# Patient Record
Sex: Female | Born: 1950 | Race: Black or African American | Hispanic: No | Marital: Married | Smoking: Former smoker
Health system: Southern US, Community
[De-identification: ages and names within clinical notes are randomized; demographics above are authoritative.]

## PROBLEM LIST (undated history)

## (undated) DIAGNOSIS — E876 Hypokalemia: Secondary | ICD-10-CM

## (undated) DIAGNOSIS — D649 Anemia, unspecified: Secondary | ICD-10-CM

## (undated) DIAGNOSIS — M797 Fibromyalgia: Secondary | ICD-10-CM

## (undated) DIAGNOSIS — E079 Disorder of thyroid, unspecified: Secondary | ICD-10-CM

## (undated) DIAGNOSIS — F419 Anxiety disorder, unspecified: Secondary | ICD-10-CM

## (undated) DIAGNOSIS — K2970 Gastritis, unspecified, without bleeding: Secondary | ICD-10-CM

## (undated) DIAGNOSIS — R5383 Other fatigue: Secondary | ICD-10-CM

## (undated) DIAGNOSIS — R51 Headache: Secondary | ICD-10-CM

## (undated) HISTORY — DX: Disorder of thyroid, unspecified: E07.9

## (undated) HISTORY — PX: JOINT REPLACEMENT: SHX530

## (undated) HISTORY — DX: Headache: R51

## (undated) HISTORY — PX: EYE SURGERY: SHX253

## (undated) HISTORY — DX: Fibromyalgia: M79.7

## (undated) HISTORY — DX: Anemia, unspecified: D64.9

## (undated) HISTORY — DX: Gastritis, unspecified, without bleeding: K29.70

## (undated) HISTORY — DX: Anxiety disorder, unspecified: F41.9

## (undated) HISTORY — PX: SPINE SURGERY: SHX786

## (undated) HISTORY — DX: Other fatigue: R53.83

## (undated) HISTORY — DX: Hypokalemia: E87.6

---

## 2009-12-02 ENCOUNTER — Encounter: Admission: RE | Admit: 2009-12-02 | Discharge: 2009-12-02 | Payer: Self-pay | Admitting: *Deleted

## 2010-03-06 ENCOUNTER — Inpatient Hospital Stay (HOSPITAL_COMMUNITY): Admission: RE | Admit: 2010-03-06 | Discharge: 2010-03-10 | Payer: Self-pay | Admitting: Orthopedic Surgery

## 2010-03-22 ENCOUNTER — Ambulatory Visit: Payer: Self-pay | Admitting: Vascular Surgery

## 2010-03-22 ENCOUNTER — Encounter (INDEPENDENT_AMBULATORY_CARE_PROVIDER_SITE_OTHER): Payer: Self-pay | Admitting: Orthopedic Surgery

## 2010-03-22 ENCOUNTER — Ambulatory Visit
Admission: RE | Admit: 2010-03-22 | Discharge: 2010-03-22 | Payer: Self-pay | Source: Home / Self Care | Admitting: Orthopedic Surgery

## 2010-06-20 ENCOUNTER — Ambulatory Visit (HOSPITAL_COMMUNITY): Admission: RE | Admit: 2010-06-20 | Discharge: 2010-06-20 | Payer: Self-pay | Admitting: Orthopedic Surgery

## 2010-06-26 ENCOUNTER — Inpatient Hospital Stay (HOSPITAL_COMMUNITY): Admission: EM | Admit: 2010-06-26 | Discharge: 2010-06-28 | Payer: Self-pay | Admitting: Emergency Medicine

## 2010-06-27 ENCOUNTER — Encounter (INDEPENDENT_AMBULATORY_CARE_PROVIDER_SITE_OTHER): Payer: Self-pay | Admitting: Internal Medicine

## 2010-06-27 ENCOUNTER — Ambulatory Visit: Payer: Self-pay | Admitting: Vascular Surgery

## 2010-06-30 ENCOUNTER — Inpatient Hospital Stay (HOSPITAL_COMMUNITY): Admission: RE | Admit: 2010-06-30 | Discharge: 2010-07-03 | Payer: Self-pay | Admitting: Orthopedic Surgery

## 2010-12-28 LAB — COMPREHENSIVE METABOLIC PANEL
ALT: 11 U/L (ref 0–35)
AST: 16 U/L (ref 0–37)
Albumin: 3.1 g/dL — ABNORMAL LOW (ref 3.5–5.2)
CO2: 28 mEq/L (ref 19–32)
Chloride: 106 mEq/L (ref 96–112)
Creatinine, Ser: 0.89 mg/dL (ref 0.4–1.2)
GFR calc Af Amer: >60 mL/min (ref >60)
GFR calc non Af Amer: >60 mL/min (ref >60)
Sodium: 139 mEq/L (ref 135–145)
Total Bilirubin: 0.9 mg/dL (ref 0.3–1.2)

## 2010-12-28 LAB — APTT: aPTT: 42 seconds — ABNORMAL HIGH (ref 24–37)

## 2010-12-28 LAB — PROTIME-INR
INR: 1.01 (ref 0.00–1.49)
INR: 1.01 (ref 0.00–1.49)
Prothrombin Time: 13.5 seconds (ref 11.6–15.2)
Prothrombin Time: 17.4 seconds — ABNORMAL HIGH (ref 11.6–15.2)
Prothrombin Time: 17.8 seconds — ABNORMAL HIGH (ref 11.6–15.2)
Prothrombin Time: 13.5 seconds (ref 11.6–15.2)

## 2010-12-28 LAB — CBC
HCT: 24 % — ABNORMAL LOW (ref 36.0–46.0)
HCT: 32.5 % — ABNORMAL LOW (ref 36.0–46.0)
Hemoglobin: 8 g/dL — ABNORMAL LOW (ref 12.0–15.0)
Hemoglobin: 8.3 g/dL — ABNORMAL LOW (ref 12.0–15.0)
Hemoglobin: 9.4 g/dL — ABNORMAL LOW (ref 12.0–15.0)
MCH: 25.6 pg — ABNORMAL LOW (ref 26.0–34.0)
MCH: 26.1 pg (ref 26.0–34.0)
MCH: 24.8 pg — ABNORMAL LOW (ref 26.0–34.0)
MCH: 25.6 pg — ABNORMAL LOW (ref 26.0–34.0)
MCHC: 31.9 g/dL (ref 30.0–36.0)
MCHC: 32.5 g/dL (ref 30.0–36.0)
MCHC: 33.3 g/dL (ref 30.0–36.0)
MCHC: 32.2 g/dL (ref 30.0–36.0)
MCHC: 32.6 g/dL (ref 30.0–36.0)
MCV: 79.7 fL (ref 78.0–100.0)
MCV: 78 fL (ref 78.0–100.0)
MCV: 78.5 fL (ref 78.0–100.0)
Platelets: 202 10*3/uL (ref 150–400)
RBC: 3.7 MIL/uL — ABNORMAL LOW (ref 3.87–5.11)
RDW: 15.2 % (ref 11.5–15.5)
RDW: 15.2 % (ref 11.5–15.5)
RDW: 15.2 % (ref 11.5–15.5)
RDW: 15.3 % (ref 11.5–15.5)
RDW: 15.5 % (ref 11.5–15.5)
WBC: 14.3 10*3/uL — ABNORMAL HIGH (ref 4.0–10.5)
WBC: 12.1 10*3/uL — ABNORMAL HIGH (ref 4.0–10.5)

## 2010-12-28 LAB — URINE MICROSCOPIC-ADD ON

## 2010-12-28 LAB — BASIC METABOLIC PANEL
BUN: 14 mg/dL (ref 6–23)
CO2: 29 mEq/L (ref 19–32)
Calcium: 8.8 mg/dL (ref 8.4–10.5)
Calcium: 9.5 mg/dL (ref 8.4–10.5)
Chloride: 106 mEq/L (ref 96–112)
Creatinine, Ser: 0.81 mg/dL (ref 0.4–1.2)
GFR calc Af Amer: >60 mL/min (ref >60)
GFR calc Af Amer: >60 mL/min (ref >60)
GFR calc Af Amer: >60 mL/min (ref >60)
GFR calc non Af Amer: >60 mL/min (ref >60)
GFR calc non Af Amer: >60 mL/min (ref >60)
Glucose, Bld: 139 mg/dL — ABNORMAL HIGH (ref 70–99)
Glucose, Bld: 92 mg/dL (ref 70–99)
Potassium: 3.7 mEq/L (ref 3.5–5.1)
Sodium: 131 mEq/L — ABNORMAL LOW (ref 135–145)
Sodium: 139 mEq/L (ref 135–145)
Sodium: 141 mEq/L (ref 135–145)

## 2010-12-28 LAB — CK TOTAL AND CKMB (NOT AT ARMC): CK, MB: 1 ng/mL (ref 0.3–4.0)

## 2010-12-28 LAB — URINALYSIS, ROUTINE W REFLEX MICROSCOPIC
Bilirubin Urine: NEGATIVE
Glucose, UA: NEGATIVE mg/dL
Hgb urine dipstick: NEGATIVE
Nitrite: NEGATIVE
Nitrite: NEGATIVE
Protein, ur: NEGATIVE mg/dL
Specific Gravity, Urine: 1.021 (ref 1.005–1.030)
Specific Gravity, Urine: 1.018 (ref 1.005–1.030)
Urobilinogen, UA: 1 mg/dL (ref 0.0–1.0)

## 2010-12-28 LAB — TYPE AND SCREEN
ABO/RH(D): A POS
ABO/RH(D): A POS
Antibody Screen: POSITIVE
DAT, IgG: NEGATIVE
Donor AG Type: NEGATIVE

## 2010-12-28 LAB — DIFFERENTIAL
Basophils Absolute: 0 10*3/uL (ref 0.0–0.1)
Eosinophils Absolute: 0.4 10*3/uL (ref 0.0–0.7)
Eosinophils Relative: 3 % (ref 0–5)
Lymphocytes Relative: 25 % (ref 12–46)
Lymphocytes Relative: 25 % (ref 12–46)
Monocytes Absolute: 0.7 10*3/uL (ref 0.1–1.0)
Neutro Abs: 6 10*3/uL (ref 1.7–7.7)
Neutrophils Relative %: 66 % (ref 43–77)

## 2010-12-28 LAB — CARDIAC PANEL(CRET KIN+CKTOT+MB+TROPI)
CK, MB: 0.7 ng/mL (ref 0.3–4.0)
Relative Index: INVALID (ref 0.0–2.5)
Relative Index: INVALID (ref 0.0–2.5)
Total CK: 45 U/L (ref 7–177)
Troponin I: 0.01 ng/mL (ref 0.00–0.06)
Troponin I: 0.02 ng/mL (ref 0.00–0.06)

## 2010-12-28 LAB — POCT CARDIAC MARKERS: Troponin i, poc: <0.05 ng/mL (ref 0.00–0.09)

## 2010-12-28 LAB — POCT I-STAT, CHEM 8
Calcium, Ion: 1.14 mmol/L (ref 1.12–1.32)
Chloride: 100 mEq/L (ref 96–112)
Creatinine, Ser: 1.1 mg/dL (ref 0.4–1.2)
Glucose, Bld: 103 mg/dL — ABNORMAL HIGH (ref 70–99)
HCT: 37 % (ref 36.0–46.0)
Hemoglobin: 12.6 g/dL (ref 12.0–15.0)
Sodium: 137 mEq/L (ref 135–145)
TCO2: 28 mmol/L (ref 0–100)

## 2010-12-28 LAB — GLUCOSE, CAPILLARY: Glucose-Capillary: 111 mg/dL — ABNORMAL HIGH (ref 70–99)

## 2010-12-28 LAB — SURGICAL PCR SCREEN
MRSA, PCR: NEGATIVE
Staphylococcus aureus: POSITIVE — AB

## 2010-12-28 LAB — URINE CULTURE

## 2010-12-28 LAB — BRAIN NATRIURETIC PEPTIDE: Pro B Natriuretic peptide (BNP): 119 pg/mL — ABNORMAL HIGH (ref 0.0–100.0)

## 2011-01-01 LAB — CBC
HCT: 29.4 % — ABNORMAL LOW (ref 36.0–46.0)
HCT: 31.6 % — ABNORMAL LOW (ref 36.0–46.0)
HCT: 36 % (ref 36.0–46.0)
Hemoglobin: 10.2 g/dL — ABNORMAL LOW (ref 12.0–15.0)
MCHC: 32.3 g/dL (ref 30.0–36.0)
MCHC: 33.2 g/dL (ref 30.0–36.0)
MCV: 84.8 fL (ref 78.0–100.0)
MCV: 85.6 fL (ref 78.0–100.0)
MCV: 85.7 fL (ref 78.0–100.0)
Platelets: 208 10*3/uL (ref 150–400)
Platelets: 220 10*3/uL (ref 150–400)
Platelets: 273 10*3/uL (ref 150–400)
RDW: 14.2 % (ref 11.5–15.5)
RDW: 14.4 % (ref 11.5–15.5)
RDW: 14.5 % (ref 11.5–15.5)

## 2011-01-01 LAB — TYPE AND SCREEN
ABO/RH(D): A POS
ABO/RH(D): A POS
DAT, IgG: NEGATIVE
Donor AG Type: NEGATIVE
PT AG Type: NEGATIVE

## 2011-01-01 LAB — BASIC METABOLIC PANEL
BUN: 16 mg/dL (ref 6–23)
CO2: 31 mEq/L (ref 19–32)
Calcium: 9.1 mg/dL (ref 8.4–10.5)
Chloride: 102 mEq/L (ref 96–112)
Creatinine, Ser: 1.12 mg/dL (ref 0.4–1.2)
Glucose, Bld: 140 mg/dL — ABNORMAL HIGH (ref 70–99)
Glucose, Bld: 78 mg/dL (ref 70–99)
Potassium: 3.5 mEq/L (ref 3.5–5.1)
Potassium: 3.6 mEq/L (ref 3.5–5.1)
Sodium: 139 mEq/L (ref 135–145)

## 2011-01-01 LAB — DIFFERENTIAL
Basophils Absolute: 0 10*3/uL (ref 0.0–0.1)
Basophils Relative: 0 % (ref 0–1)
Eosinophils Absolute: 0.2 10*3/uL (ref 0.0–0.7)
Eosinophils Relative: 2 % (ref 0–5)
Neutrophils Relative %: 76 % (ref 43–77)

## 2011-01-01 LAB — PROTIME-INR
INR: 1.32 (ref 0.00–1.49)
Prothrombin Time: 14.3 seconds (ref 11.6–15.2)
Prothrombin Time: 15.2 seconds (ref 11.6–15.2)
Prothrombin Time: 16.3 seconds — ABNORMAL HIGH (ref 11.6–15.2)

## 2011-01-01 LAB — URINALYSIS, ROUTINE W REFLEX MICROSCOPIC
Glucose, UA: NEGATIVE mg/dL
Hgb urine dipstick: NEGATIVE
Ketones, ur: 15 mg/dL — AB
Protein, ur: NEGATIVE mg/dL

## 2011-01-12 IMAGING — CR DG KNEE 1-2V PORT*L*
3 series · 3 of 3 positions shown · non-contrast
Comparison: None.

CLINICAL DATA: Post left total knee replacement

PORTABLE LEFT KNEE - 1-2 VIEW

[view not recorded (1 of 3)]
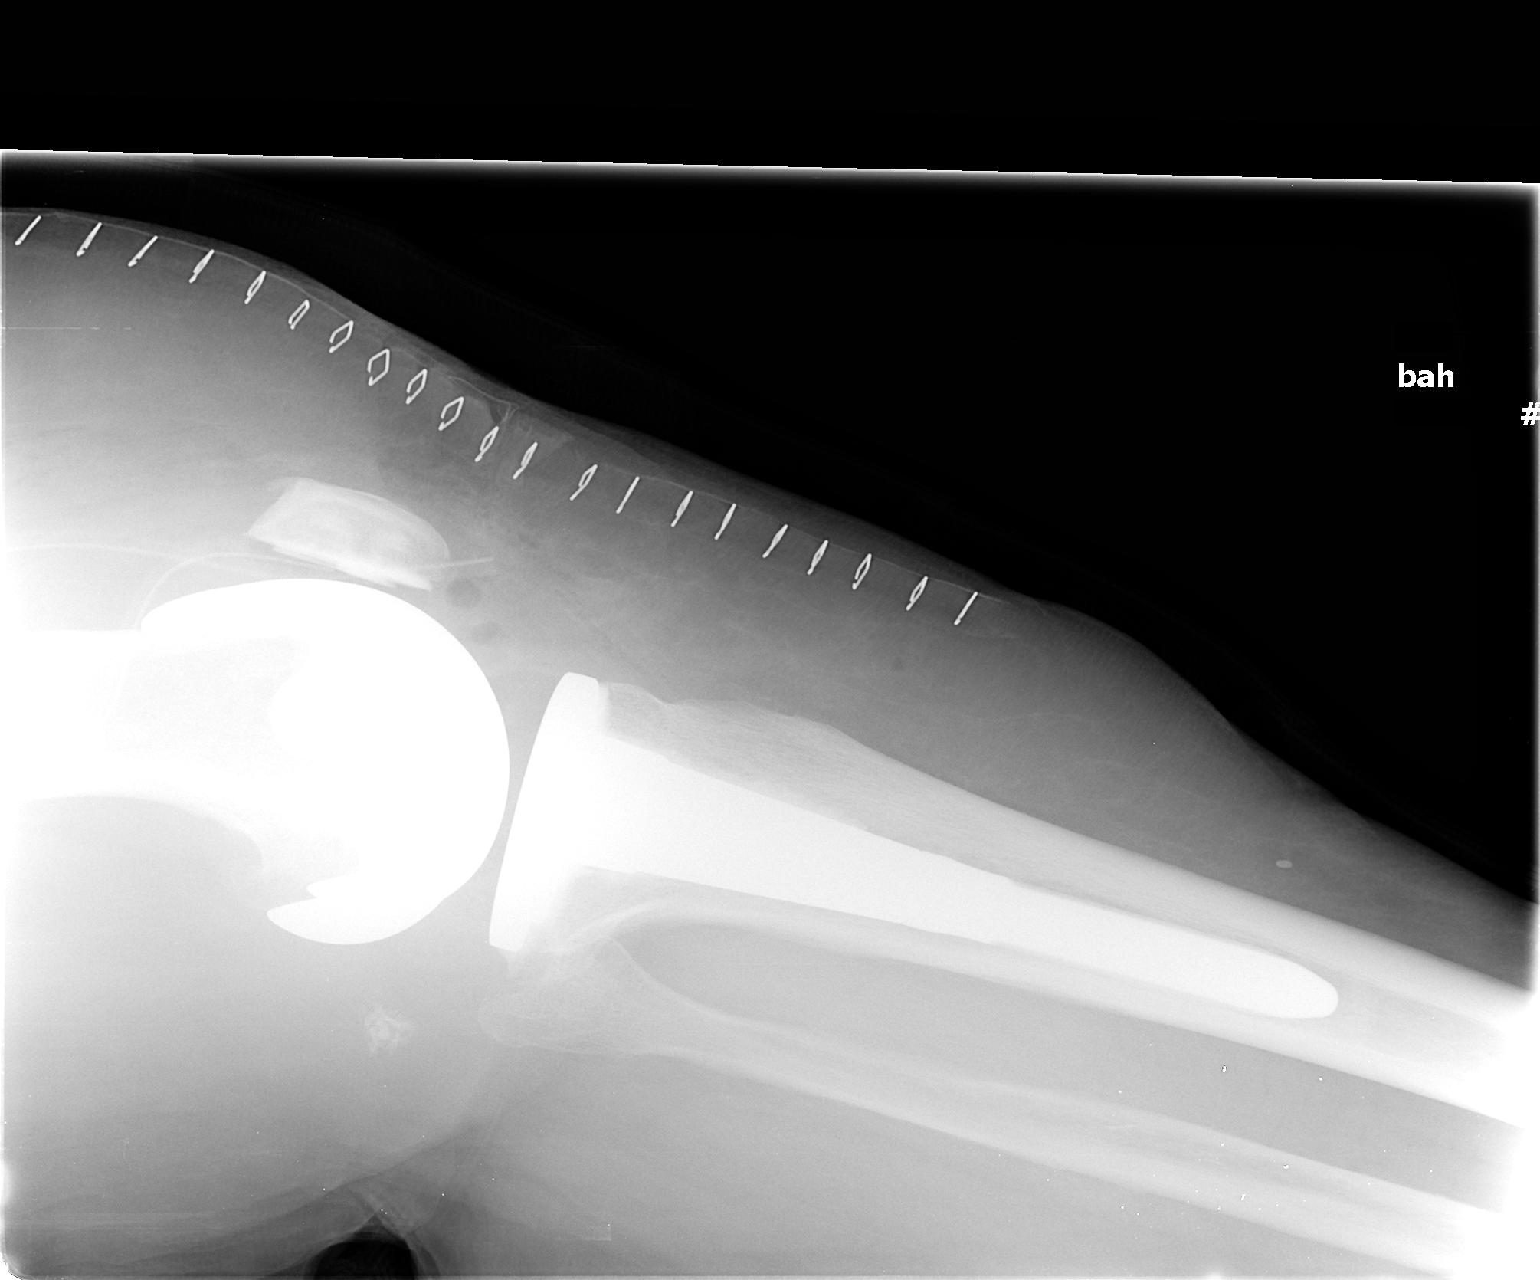

[view not recorded (2 of 3)]
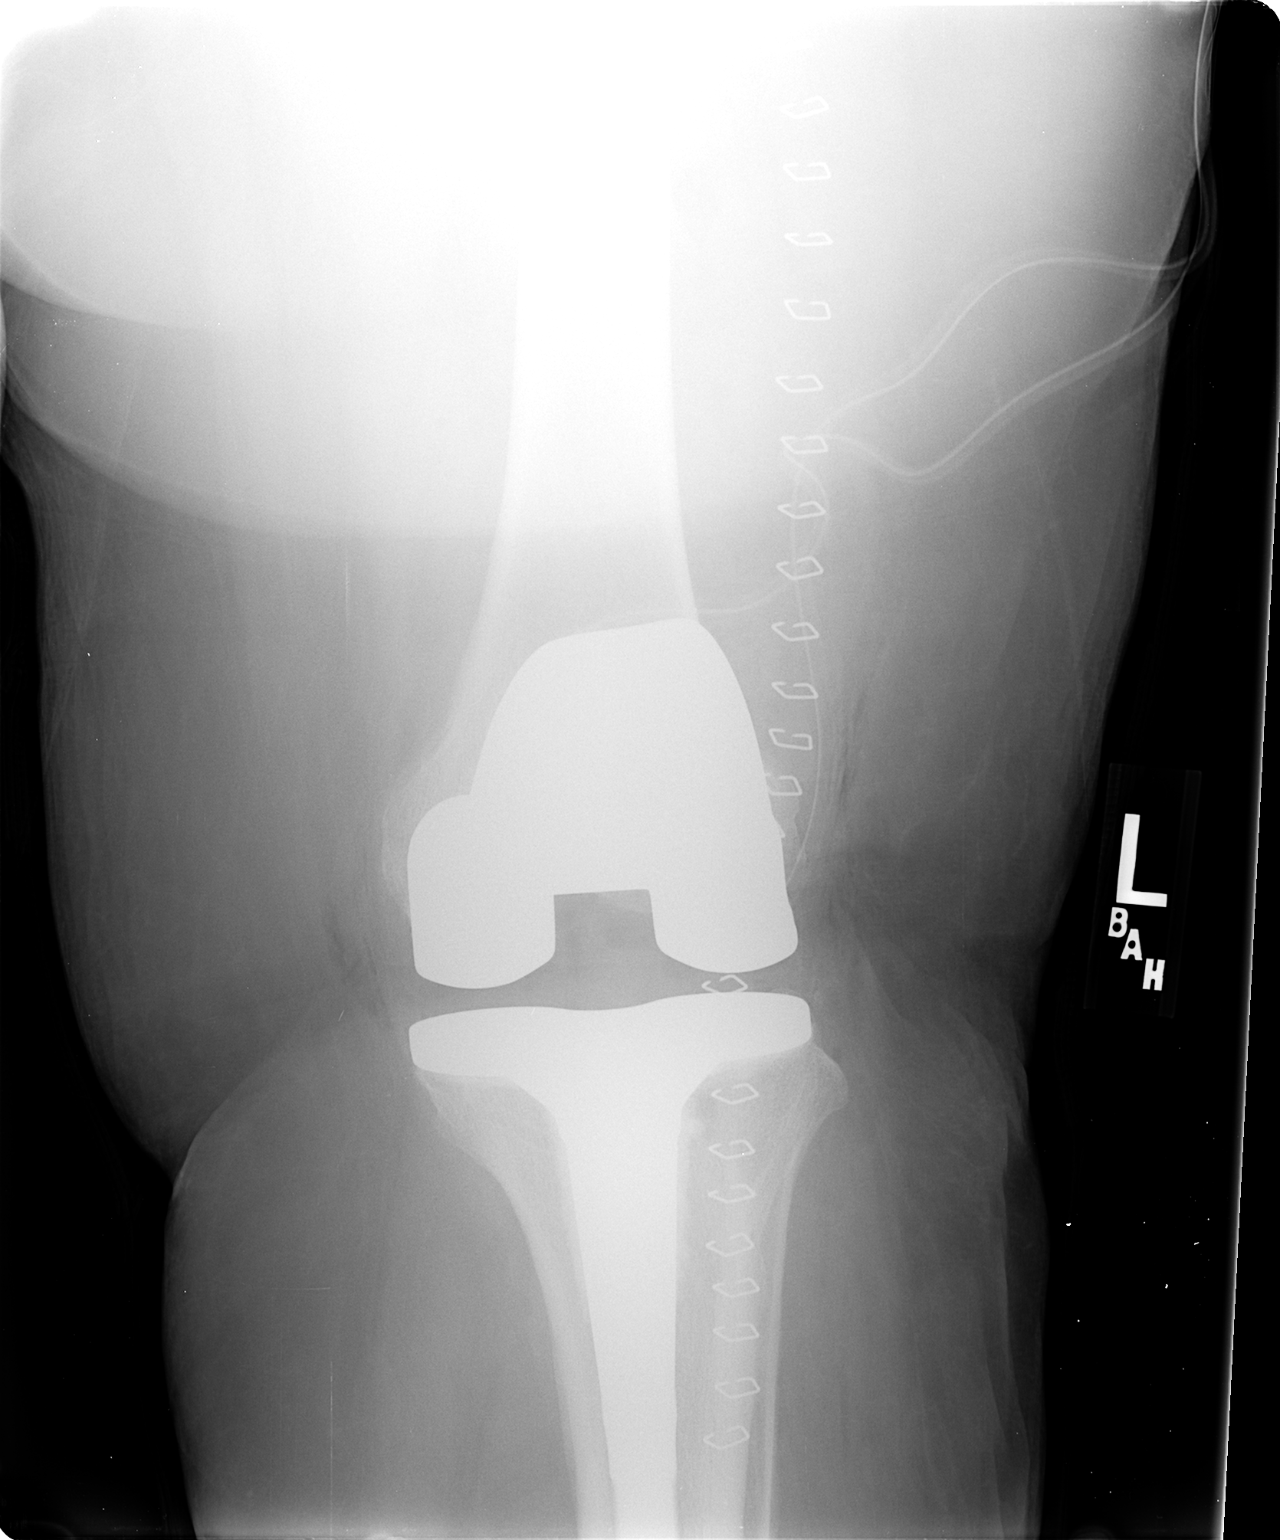

[view not recorded (3 of 3)]
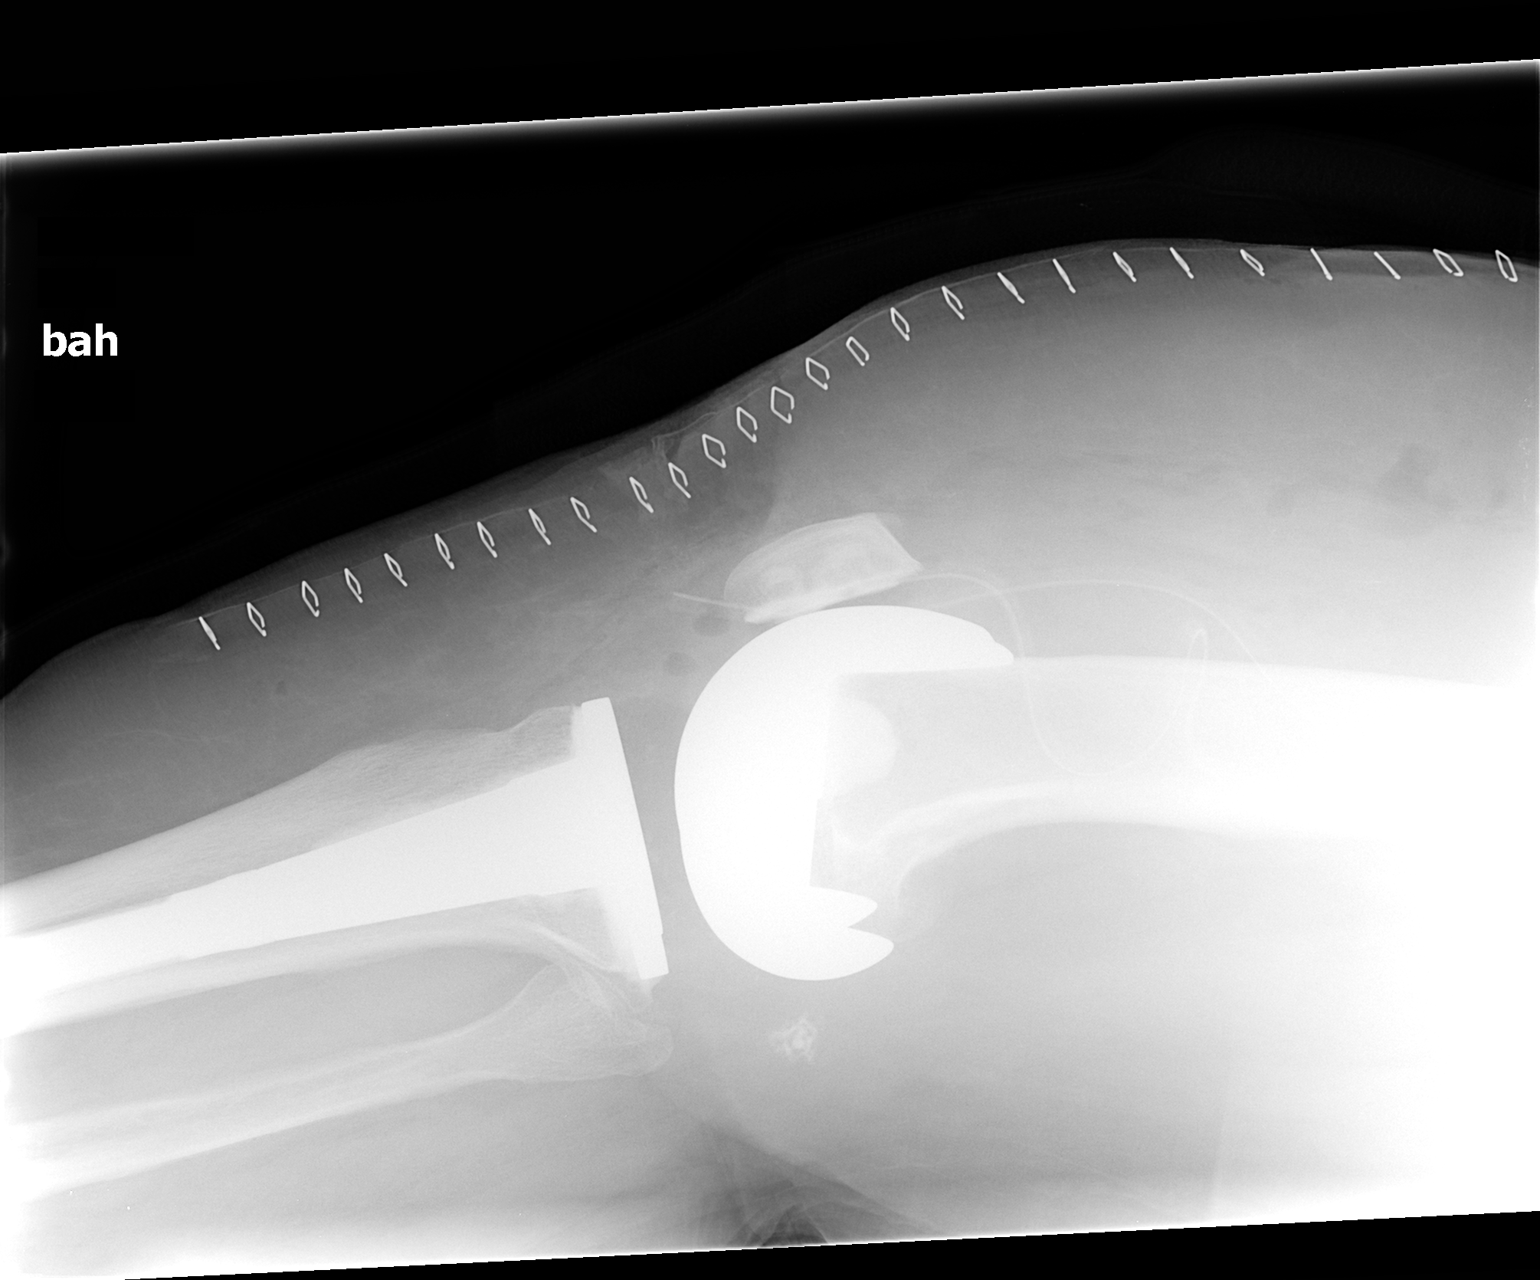

[3 of 3 positions shown; findings below may reference images not displayed]

FINDINGS: AP and cross-table lateral views show good position and
alignment of bony elements and prosthetic hardware, following left
total knee replacement.  A surgical drain is in the knee joint
space.
IMPRESSION: Good position and alignment following left total knee replacement.

## 2012-09-01 ENCOUNTER — Ambulatory Visit (INDEPENDENT_AMBULATORY_CARE_PROVIDER_SITE_OTHER): Payer: BC Managed Care – PPO | Admitting: Psychiatry

## 2012-09-01 ENCOUNTER — Encounter (HOSPITAL_COMMUNITY): Payer: Self-pay | Admitting: Psychiatry

## 2012-09-01 ENCOUNTER — Telehealth (HOSPITAL_COMMUNITY): Payer: Self-pay | Admitting: *Deleted

## 2012-09-01 VITALS — Ht 62.5 in | Wt 264.6 lb

## 2012-09-01 DIAGNOSIS — F411 Generalized anxiety disorder: Secondary | ICD-10-CM

## 2012-09-01 DIAGNOSIS — F419 Anxiety disorder, unspecified: Secondary | ICD-10-CM | POA: Insufficient documentation

## 2012-09-01 DIAGNOSIS — F5105 Insomnia due to other mental disorder: Secondary | ICD-10-CM

## 2012-09-01 DIAGNOSIS — F332 Major depressive disorder, recurrent severe without psychotic features: Secondary | ICD-10-CM

## 2012-09-01 DIAGNOSIS — F339 Major depressive disorder, recurrent, unspecified: Secondary | ICD-10-CM

## 2012-09-01 MED ORDER — ZOLPIDEM TARTRATE 10 MG PO TABS: 10.0000 mg | ORAL_TABLET | Freq: Every evening | ORAL | Status: DC | PRN

## 2012-09-01 MED ORDER — ALPRAZOLAM 0.5 MG PO TABS: 0.5000 mg | ORAL_TABLET | Freq: Four times a day (QID) | ORAL | Status: DC | PRN

## 2012-09-01 NOTE — Patient Instructions (Addendum)
Continue current effective medications  RTC 3 months 15 minutes  Keep meditation going.

## 2012-09-01 NOTE — Progress Notes (Signed)
Psychiatric Assessment Adult  Patient Identification:  Melinda Wilcox Date of Evaluation:  09/01/2012 Chief Complaint: I'm depressed and my son just died suddenly.   History of Chief Complaint:   Chief Complaint  Patient presents with  . Depression  . Establish Care  . Other    pain, insomna    HPI Pt has been in treatment for depression for 30 years or more.  She had been tried on several different meds, but responds well to NAME BRAND Prozac.  The generic dose not work.  She has been on Xanax for a very long time.  She has noted some forgetfulness like exact dates and all, but can generally recall the year that things happened.   Review of Systems On meds for blood pressure, arthritis,, and breathing.  She has gotten off oxygen this year. Physical Exam Height 5' 2.5" (1.588 m), weight 264 lb 9.6 oz (120.022 kg).  Depressive Symptoms: depressed mood, anhedonia, insomnia, hypersomnia, fatigue, impaired memory, anxiety, panic attacks, hypersomnia, loss of energy/fatigue, disturbed sleep, weight loss, decreased labido,  (Hypo) Manic Symptoms:   Elevated Mood:  No Irritable Mood:  No Grandiosity:  No Distractibility:  No Labiality of Mood:  No Delusions:  No Hallucinations:  No Impulsivity:  No Sexually Inappropriate Behavior:  No Financial Extravagance:  No Flight of Ideas:  No  Anxiety Symptoms: Excessive Worry:  Yes Panic Symptoms:  Yes Agoraphobia:  Yes Obsessive Compulsive: No  Symptoms: None, Specific Phobias:  No Social Anxiety:  No  Psychotic Symptoms:  Hallucinations: No None Delusions:  No Paranoia:  No   Ideas of Reference:  No  PTSD Symptoms: Ever had a traumatic exposure:  Yes Had a traumatic exposure in the last month:  Yes Re-experiencing: No None Hypervigilance:  Yes Hyperarousal: No None Avoidance: No None  Traumatic Brain Injury: No   Past Psychiatric History: Diagnosis: Major Depression, recurrent on Xanax  Hospitalizations:  none  Outpatient Care: Dr Cleon Gustin  Substance Abuse Care: none  Self-Mutilation: none  Suicidal Attempts: none  Violent Behaviors: none   Past Medical History:   Past Medical History  Diagnosis Date  . Thyroid disease   . Anemia   . Fibromyalgia   . Gastritis   . Hypokalemia   . Headache   . Fatigue   . Anxiety    History of Loss of Consciousness:  No Seizure History:  No Cardiac History:  Yes Allergies:  Not on File Current Medications:  Current Outpatient Prescriptions  Medication Sig Dispense Refill  . ALPRAZolam (XANAX) 0.25 MG tablet Take 0.5 mg by mouth 3 (three) times daily as needed.      Marland Kitchen FLUoxetine (PROZAC) 40 MG capsule Take 40 mg by mouth daily.      Marland Kitchen zolpidem (AMBIEN) 10 MG tablet Take 10 mg by mouth at bedtime as needed.        Previous Psychotropic Medications:  Medication Dose   Lots of different antiderpessants   Prozac 40mg   Xanax 1 mg a day.               Substance Abuse History in the last 12 months: Substance Age of 1st Use Last Use Amount Specific Type  Nicotine  15 Age 34    Alcohol none       Cannabis  none     Opiates  59 Age 38    Cocaine  none     Methamphetamines  none       LSD  none  Ecstasy  none       Benzodiazepines  30's today 0.5mg  Xanax  Caffeine  childhood  two days ago  cup  coffee  Inhalants          Others:                          Medical Consequences of Substance Abuse: none  Legal Consequences of Substance Abuse: none  Family Consequences of Substance Abuse: none  Blackouts:  no DT's:  No Withdrawal Symptoms:  No   Social History: Current Place of Residence: [place], [place] Place of Birth: [place], [place] Family Members:  Marital Status:  Married Children:   Sons: deceased  Daughters: one Relationships:  Education:  Corporate treasurer Problems/Performance: fine Religious Beliefs/Practices: protestant History of Abuse: none Teacher, music History:  None. Legal  History: none Hobbies/Interests: crafts, computers  Family History:   Family History  Problem Relation Age of Onset  . ADD / ADHD Neg Hx   . Alcohol abuse Neg Hx   . Anxiety disorder Neg Hx   . Bipolar disorder Neg Hx   . Dementia Neg Hx   . Depression Neg Hx   . Drug abuse Neg Hx   . OCD Neg Hx   . Paranoid behavior Neg Hx   . Physical abuse Neg Hx   . Schizophrenia Neg Hx   . Seizures Neg Hx   . Sexual abuse Neg Hx     Mental Status Examination/Evaluation: Objective:  Appearance: Casual  Eye Contact::  Good  Speech:  Clear and Coherent  Volume:  Normal  Mood:  sad  Affect:  Blunt  Thought Process:  Coherent  Orientation:  Full  Thought Content:  WDL  Suicidal Thoughts:  No  Homicidal Thoughts:  No  Judgement:  Good  Insight:  Good  Psychomotor Activity:  Normal  Akathisia:  No  Handed:  Right  AIMS (if indicated):    Assets:  Communication Skills Desire for Improvement    Laboratory/X-Ray Psychological Evaluation(s)        Assessment:    AXIS I Generalized Anxiety Disorder and Major Depression, Recurrent severe  AXIS II Deferred  AXIS III Past Medical History  Diagnosis Date  . Thyroid disease   . Anemia   . Fibromyalgia   . Gastritis   . Hypokalemia   . Headache   . Fatigue   . Anxiety      AXIS IV other psychosocial or environmental problems  AXIS V 51-60 moderate symptoms   Treatment Plan/Recommendations:  Plan of Care: Continue current meds  Laboratory:    Psychotherapy: none  Medications: Prozac, Xanax  Routine PRN Medications:  Yes  Consultations: none  Safety Concerns:  none  Other:      Orson Aloe, MD 11/18/201311:54 AM

## 2012-12-02 ENCOUNTER — Ambulatory Visit (HOSPITAL_COMMUNITY): Payer: Self-pay | Admitting: Psychiatry

## 2013-03-16 ENCOUNTER — Encounter (HOSPITAL_COMMUNITY): Payer: Self-pay | Admitting: Psychiatry

## 2013-03-16 ENCOUNTER — Ambulatory Visit (INDEPENDENT_AMBULATORY_CARE_PROVIDER_SITE_OTHER): Payer: MEDICARE | Admitting: Psychiatry

## 2013-03-16 VITALS — BP 110/78 | Ht 61.25 in | Wt 263.6 lb

## 2013-03-16 DIAGNOSIS — F5105 Insomnia due to other mental disorder: Secondary | ICD-10-CM

## 2013-03-16 DIAGNOSIS — F411 Generalized anxiety disorder: Secondary | ICD-10-CM

## 2013-03-16 DIAGNOSIS — F419 Anxiety disorder, unspecified: Secondary | ICD-10-CM

## 2013-03-16 DIAGNOSIS — F332 Major depressive disorder, recurrent severe without psychotic features: Secondary | ICD-10-CM

## 2013-03-16 DIAGNOSIS — F339 Major depressive disorder, recurrent, unspecified: Secondary | ICD-10-CM

## 2013-03-16 MED ORDER — FLUOXETINE HCL 20 MG PO CAPS: 20.0000 mg | ORAL_CAPSULE | Freq: Every day | ORAL | Status: DC

## 2013-03-16 MED ORDER — ALPRAZOLAM 0.5 MG PO TABS: 0.5000 mg | ORAL_TABLET | Freq: Four times a day (QID) | ORAL | Status: DC | PRN

## 2013-03-16 NOTE — Patient Instructions (Signed)
Set a timer for 8 or a certain number minutes and walk for that amount of time in the house or in the yard.  Mark the number of minutes on a calendar for that day.  Do that every day this week.  Then next week increase the time by 1 minutes and then mark the calendar with the number of minutes for that day.  Each week increase your exercise by one minute.  Keep a record of this so you can see the progress you are making.  Do this every day, just like eating and sleeping.  It is good for pain control, depression, and for your soul/spirit.  Bring the record in for your next visit so we can talk about your effort and how you feel with the new exercise program going and working for you.  Relaxation is the ultimate solution for you.  You can seek it through tub baths, bubble baths, essential oils or incense, walking or chatting with friends, listening to soft music, watching a candle burn and just letting all thoughts go and appreciating the true essence of the Creator.  Pets or animals may be very helpful.  You might spend some time with them and then go do more directed meditation.  "I am Wishes Fulfilled Meditation" by Marylene Buerger and Lyndal Pulley may be helpful MUSIC for getting to sleep or for meditating You can order it from on line.  You might find the Chill channel on Pandora and explore the artists that you like better.   Take care of yourself.  No one else is standing up to do the job and only you know what you need.   GET SERIOUS about taking care of yourself.  Do the next right thing and that often means doing something to care for yourself along the lines of are you hungry, are you angry, are you lonely, are you tired, are you scared?  HALTS is what that stands for.  Call if problems or concerns.

## 2013-03-16 NOTE — Progress Notes (Signed)
[facility] [address] Progress Note CHRISTL FESSENDEN MRN: [id number] DOB: 04-Jan-1951 Age: 62 y.o.  Date: 03/16/2013 Start Time: 11:00 AM End Time: 11:30 AM  Chief Complaint: Chief Complaint  Patient presents with  . Depression  . Follow-up  . Medication Refill   Subjective: "I have been staying in my room.  I have been having panic attacks when I leave my room.  There was as problem with switching over to a new insurance and I couldn't afford much of the Xanax and I have been streatching them out and I found an old script. Depression 5/10 and Anxiety 4/10, where 0 is none and 10 is the worst.  Pain is 6/10 for her back.    The patient returns for follow-up appointment.  Pt reports that she is compliant with the psychotropic medications with good benefit and no noticeable side effects.  But the effect is lacking.  Pt agrees to try a higher dose.   Current Meds: Xanax 0.5 mg every other day Prozac 40 mg about every day Ambien 10 mg at night occasionally  HPI Pt has been in treatment for depression for 30 years or more.  She had been tried on several different meds, but responds well to NAME BRAND Prozac.  The generic dose not work.  She has been on Xanax for a very long time.  She has noted some forgetfulness like exact dates and all, but can generally recall the year that things happened.   Review of Systems On meds for blood pressure, arthritis,, and breathing.  She has gotten off oxygen this year. Physical Exam Blood pressure 110/78, height 5' 1.25" (1.556 m), weight 263 lb 9.6 oz (119.568 kg).  Depressive Symptoms: depressed mood, anhedonia, insomnia, hypersomnia, fatigue, impaired memory, anxiety, panic attacks, hypersomnia, loss of energy/fatigue, disturbed sleep, weight loss, decreased labido,  (Hypo) Manic Symptoms:   Elevated Mood:  No Irritable Mood:  No Grandiosity:  No Distractibility:  No Labiality of Mood:  No Delusions:  No Hallucinations:   No Impulsivity:  No Sexually Inappropriate Behavior:  No Financial Extravagance:  No Flight of Ideas:  No  Anxiety Symptoms: Excessive Worry:  Yes Panic Symptoms:  Yes Agoraphobia:  Yes Obsessive Compulsive: No  Symptoms: None, Specific Phobias:  No Social Anxiety:  No  Psychotic Symptoms:  Hallucinations: No None Delusions:  No Paranoia:  No   Ideas of Reference:  No  PTSD Symptoms: Ever had a traumatic exposure:  Yes Had a traumatic exposure in the last month:  Yes Re-experiencing: No None Hypervigilance:  Yes Hyperarousal: No None Avoidance: No None  Traumatic Brain Injury: No   Past Psychiatric History: Diagnosis: Major Depression, recurrent on Xanax  Hospitalizations: none  Outpatient Care: Dr Cleon Gustin  Substance Abuse Care: none  Self-Mutilation: none  Suicidal Attempts: none  Violent Behaviors: none   Past Medical History:   Past Medical History  Diagnosis Date  . Thyroid disease   . Anemia   . Fibromyalgia   . Gastritis   . Hypokalemia   . Headache(784.0)   . Fatigue   . Anxiety    History of Loss of Consciousness:  No Seizure History:  No Cardiac History:  Yes  Allergies: No Known Allergies Medical History: Past Medical History  Diagnosis Date  . Thyroid disease   . Anemia   . Fibromyalgia   . Gastritis   . Hypokalemia   . Headache(784.0)   . Fatigue   . Anxiety    Surgical History: Past Surgical History  Procedure Laterality Date  . Joint replacement    . Spine surgery    . Eye surgery     Family History: family history is negative for ADD / ADHD, and Alcohol abuse, and Anxiety disorder, and Bipolar disorder, and Dementia, and Depression, and Drug abuse, and OCD, and Paranoid behavior, and Physical abuse, and Schizophrenia, and Seizures, and Sexual abuse, . Reviewed again to day in the office setting.  Previous Psychotropic Medications:  Medication Dose   Lots of different antiderpessants   Prozac 40mg   Xanax 1 mg a day.    Substance Abuse History in the last 12 months: Substance Age of 1st Use Last Use Amount Specific Type  Nicotine  15 Age 36    Alcohol none       Cannabis  none     Opiates  59 Age 44    Cocaine  none     Methamphetamines  none       LSD  none        Ecstasy  none       Benzodiazepines  30's today 0.5mg  Xanax  Caffeine  childhood  two days ago  cup  coffee  Inhalants          Others:      Medical Consequences of Substance Abuse: none Legal Consequences of Substance Abuse: none Family Consequences of Substance Abuse: none Blackouts:  no DT's:  No Withdrawal Symptoms:  No   Social History: Current Place of Residence: [place], [place] Place of Birth: [place], [place] Family Members:  Marital Status:  Married Children:   Sons: deceased  Daughters: one Relationships:  Education:  Corporate treasurer Problems/Performance: fine Religious Beliefs/Practices: protestant History of Abuse: none Teacher, music History:  None. Legal History: none Hobbies/Interests: crafts, computers  Mental Status Examination/Evaluation: Objective:  Appearance: Casual  Eye Contact::  Good  Speech:  Clear and Coherent  Volume:  Normal  Mood:  sad  Affect:  Blunt  Thought Process:  Coherent  Orientation:  Full  Thought Content:  WDL  Suicidal Thoughts:  No  Homicidal Thoughts:  No  Judgement:  Good  Insight:  Good  Psychomotor Activity:  Normal  Akathisia:  No  Handed:  Right  AIMS (if indicated):    Assets:  Communication Skills Desire for Improvement   Assessment:   AXIS I Generalized Anxiety Disorder and Major Depression, Recurrent severe  AXIS II Deferred  AXIS III Past Medical History  Diagnosis Date  . Thyroid disease   . Anemia   . Fibromyalgia   . Gastritis   . Hypokalemia   . Headache(784.0)   . Fatigue   . Anxiety      AXIS IV other psychosocial or environmental problems  AXIS V 51-60 moderate symptoms   Treatment  Plan/Recommendations:  Psychotherapy: none  Medications: Prozac, Xanax  Routine PRN Medications:  Yes  Consultations: none  Safety Concerns:  none  Other:     Plan/Discussion: I took her vitals.  I reviewed CC, tobacco/med/surg Hx, meds effects/ side effects, problem list, therapies and responses as well as current situation/symptoms discussed options. Continue current effective medications with increase in Prozac for better mood control. See orders and pt instructions for more details.  MEDICATIONS this encounter: Meds ordered this encounter  Medications  . FLUoxetine (PROZAC) 20 MG capsule    Sig: Take 1 capsule (20 mg total) by mouth daily.    Dispense:  30 capsule    Refill:  2  . ALPRAZolam (XANAX) 0.5 MG  tablet    Sig: Take 1 tablet (0.5 mg total) by mouth 4 (four) times daily as needed for anxiety.    Dispense:  120 tablet    Refill:  0   Medical Decision Making Problem Points:  Established problem, worsening (2), Review of last therapy session (1) and Review of psycho-social stressors (1) Data Points:  Review and summation of old records (2) Review of medication regiment & side effects (2)  I certify that outpatient services furnished can reasonably be expected to improve the patient's condition.   Orson Aloe, MD, [facility]

## 2013-03-23 ENCOUNTER — Telehealth (HOSPITAL_COMMUNITY): Payer: Self-pay | Admitting: Psychiatry

## 2013-03-27 ENCOUNTER — Telehealth (HOSPITAL_COMMUNITY): Payer: Self-pay | Admitting: Psychiatry

## 2013-03-30 ENCOUNTER — Telehealth (HOSPITAL_COMMUNITY): Payer: Self-pay | Admitting: Psychiatry

## 2013-03-30 DIAGNOSIS — F419 Anxiety disorder, unspecified: Secondary | ICD-10-CM

## 2013-03-30 MED ORDER — ALPRAZOLAM 0.5 MG PO TABS: 0.5000 mg | ORAL_TABLET | Freq: Four times a day (QID) | ORAL | Status: DC | PRN

## 2013-03-30 NOTE — Telephone Encounter (Signed)
Script printed out and pt called to come pick up for mailing to the mail order pharmacy.

## 2013-04-13 ENCOUNTER — Ambulatory Visit (HOSPITAL_COMMUNITY): Payer: Self-pay | Admitting: Psychiatry

## 2013-04-23 ENCOUNTER — Ambulatory Visit (INDEPENDENT_AMBULATORY_CARE_PROVIDER_SITE_OTHER): Payer: MEDICARE | Admitting: Psychiatry

## 2013-04-23 ENCOUNTER — Encounter (HOSPITAL_COMMUNITY): Payer: Self-pay | Admitting: Psychiatry

## 2013-04-23 ENCOUNTER — Ambulatory Visit (HOSPITAL_COMMUNITY): Payer: Self-pay | Admitting: Psychiatry

## 2013-04-23 VITALS — BP 126/100 | HR 73 | Wt 266.0 lb

## 2013-04-23 DIAGNOSIS — F339 Major depressive disorder, recurrent, unspecified: Secondary | ICD-10-CM

## 2013-04-23 DIAGNOSIS — M1990 Unspecified osteoarthritis, unspecified site: Secondary | ICD-10-CM | POA: Insufficient documentation

## 2013-04-23 DIAGNOSIS — F332 Major depressive disorder, recurrent severe without psychotic features: Secondary | ICD-10-CM

## 2013-04-23 DIAGNOSIS — F411 Generalized anxiety disorder: Secondary | ICD-10-CM

## 2013-04-23 DIAGNOSIS — K219 Gastro-esophageal reflux disease without esophagitis: Secondary | ICD-10-CM

## 2013-04-23 DIAGNOSIS — I10 Essential (primary) hypertension: Secondary | ICD-10-CM | POA: Insufficient documentation

## 2013-04-23 MED ORDER — FLUOXETINE HCL 20 MG PO CAPS: 20.0000 mg | ORAL_CAPSULE | Freq: Every day | ORAL | Status: DC

## 2013-04-23 NOTE — Progress Notes (Signed)
[facility] [address] Progress Note Melinda Wilcox MRN: [id number] DOB: Feb 25, 1951 Age: 62 y.o.  Date: 04/23/2013  Chief Complaint: Chief Complaint  Patient presents with  . Follow-up  . Medication Refill   Subjective: I'm doing better on the medication.  I wanted me refill on my medicine.  I'm sleeping better.  I anxiety and depression is much improved.  HPI Patient is a 62 year old [place] American female who came for her followup appointment.  She's compliant with her Prozac and Xanax.  She takes Xanax up to 4 times a day but usually she takes 3 a day.  She denies any recent panic attack or any nervousness.  She sleeping better.  She has chronic medical issues.  She likes to stay on Prozac 60 mg.  She's not reporting any side effects.  She denies any tremors or shakes.  She denies any crying spells.  She's not drinking or using any illegal substance.  Review of Systems  Constitutional: Negative.   Neurological: Negative.   Psychiatric/Behavioral: Negative.    Physical Exam  Psychiatric:  see mental status examination   Blood pressure 126/100, pulse 73, weight 266 lb (120.657 kg).   Traumatic Brain Injury: No   Past Psychiatric History: Diagnosis: Major Depression, recurrent on Xanax  Hospitalizations: none  Outpatient Care: Dr Cleon Gustin  Substance Abuse Care: none  Self-Mutilation: none  Suicidal Attempts: none  Violent Behaviors: none   Past Medical History:   Past Medical History  Diagnosis Date  . Thyroid disease   . Anemia   . Fibromyalgia   . Gastritis   . Hypokalemia   . Headache(784.0)   . Fatigue   . Anxiety    History of Loss of Consciousness:  No Seizure History:  No Cardiac History:  Yes  Allergies: No Known Allergies Medical History: Past Medical History  Diagnosis Date  . Thyroid disease   . Anemia   . Fibromyalgia   . Gastritis   . Hypokalemia   . Headache(784.0)   . Fatigue   . Anxiety    Surgical History: Past Surgical  History  Procedure Laterality Date  . Joint replacement    . Spine surgery    . Eye surgery     Family History: family history is negative for ADD / ADHD, and Alcohol abuse, and Anxiety disorder, and Bipolar disorder, and Dementia, and Depression, and Drug abuse, and OCD, and Paranoid behavior, and Physical abuse, and Schizophrenia, and Seizures, and Sexual abuse, . Reviewed again to day in the office setting.   Social History: Patient lives in [place].  She is married.  She has a Naval architect.  Mental Status Examination/Evaluation: Objective:  Appearance: Casual  Eye Contact::  Good  Speech:  Clear and Coherent  Volume:  Normal  Mood:  sad  Affect:  Blunt  Thought Process:  Coherent  Orientation:  Full  Thought Content:  WDL  Suicidal Thoughts:  No  Homicidal Thoughts:  No  Judgement:  Good  Insight:  Good  Psychomotor Activity:  Normal  Akathisia:  No  Handed:  Right  AIMS (if indicated):    Assets:  Communication Skills Desire for Improvement   Assessment:   AXIS I Generalized Anxiety Disorder and Major Depression, Recurrent severe  AXIS II Deferred  AXIS III Past Medical History  Diagnosis Date  . Thyroid disease   . Anemia   . Fibromyalgia   . Gastritis   . Hypokalemia   . Headache(784.0)   . Fatigue   .  Anxiety      AXIS IV other psychosocial or environmental problems  AXIS V 51-60 moderate symptoms   Treatment Plan/Recommendations:  Psychotherapy: none  Medications: Prozac, Xanax  Routine PRN Medications:  Yes  Consultations: none  Safety Concerns:  none  Other:     Plan/Discussion: Patient is doing better on her current Prozac and Xanax.  I would recommend to continue her medication.  Risk and benefit explain.  Recommend to call us back it is a question or concern if she would worsening of the symptom.  Followup in 3 months.  MEDICATIONS this encounter: Meds ordered this encounter  Medications  . carvedilol (COREG) 6.25 MG tablet     Sig:   . SYNTHROID 88 MCG tablet    Sig:   . lisinopril (PRINIVIL,ZESTRIL) 5 MG tablet    Sig:   . chlorthalidone (HYGROTON) 25 MG tablet    Sig:   . pantoprazole (PROTONIX) 40 MG tablet    Sig:   . KLOR-CON M20 20 MEQ tablet    Sig:   . prednisoLONE acetate (PRED FORTE) 1 % ophthalmic suspension    Sig:   . CELEBREX 200 MG capsule    Sig:   . FLUoxetine (PROZAC) 20 MG capsule    Sig: Take 1 capsule (20 mg total) by mouth daily.    Dispense:  90 capsule    Refill:  0   Medical Decision Making Problem Points:  Established problem, stable/improving (1), Review of last therapy session (1) and Review of psycho-social stressors (1) Data Points:  Review of medication regiment & side effects (2)  I certify that outpatient services furnished can reasonably be expected to improve the patient's condition.   Davonta Stroot T., MD

## 2013-07-08 ENCOUNTER — Telehealth (HOSPITAL_COMMUNITY): Payer: Self-pay | Admitting: Psychiatry

## 2013-07-08 ENCOUNTER — Other Ambulatory Visit (HOSPITAL_COMMUNITY): Payer: Self-pay | Admitting: Psychiatry

## 2013-07-08 DIAGNOSIS — F419 Anxiety disorder, unspecified: Secondary | ICD-10-CM

## 2013-07-08 MED ORDER — FLUOXETINE HCL 40 MG PO CAPS: 40.0000 mg | ORAL_CAPSULE | Freq: Every day | ORAL | Status: DC

## 2013-07-08 NOTE — Telephone Encounter (Signed)
prozac sent to pharmacy

## 2013-07-09 ENCOUNTER — Telehealth (HOSPITAL_COMMUNITY): Payer: Self-pay | Admitting: Psychiatry

## 2013-07-09 ENCOUNTER — Other Ambulatory Visit (HOSPITAL_COMMUNITY): Payer: Self-pay | Admitting: Psychiatry

## 2013-07-09 DIAGNOSIS — F339 Major depressive disorder, recurrent, unspecified: Secondary | ICD-10-CM

## 2013-07-09 MED ORDER — FLUOXETINE HCL 20 MG PO CAPS: 20.0000 mg | ORAL_CAPSULE | Freq: Every day | ORAL | Status: DC

## 2013-07-09 NOTE — Telephone Encounter (Signed)
Sent electronically 

## 2013-07-23 ENCOUNTER — Ambulatory Visit (HOSPITAL_COMMUNITY): Payer: Self-pay | Admitting: Psychiatry

## 2013-07-23 ENCOUNTER — Encounter (HOSPITAL_COMMUNITY): Payer: Self-pay | Admitting: Psychiatry

## 2013-09-17 ENCOUNTER — Ambulatory Visit (INDEPENDENT_AMBULATORY_CARE_PROVIDER_SITE_OTHER): Payer: MEDICARE | Admitting: Psychiatry

## 2013-09-17 ENCOUNTER — Encounter (HOSPITAL_COMMUNITY): Payer: Self-pay | Admitting: Psychiatry

## 2013-09-17 VITALS — BP 120/80 | Ht 62.0 in | Wt 264.0 lb

## 2013-09-17 DIAGNOSIS — F419 Anxiety disorder, unspecified: Secondary | ICD-10-CM

## 2013-09-17 DIAGNOSIS — F339 Major depressive disorder, recurrent, unspecified: Secondary | ICD-10-CM

## 2013-09-17 DIAGNOSIS — F411 Generalized anxiety disorder: Secondary | ICD-10-CM

## 2013-09-17 MED ORDER — FLUOXETINE HCL 40 MG PO CAPS: 40.0000 mg | ORAL_CAPSULE | Freq: Every day | ORAL | Status: DC

## 2013-09-17 MED ORDER — ALPRAZOLAM 0.5 MG PO TABS: 0.5000 mg | ORAL_TABLET | Freq: Four times a day (QID) | ORAL | Status: DC | PRN

## 2013-09-17 MED ORDER — FLUOXETINE HCL 20 MG PO CAPS: 20.0000 mg | ORAL_CAPSULE | Freq: Every day | ORAL | Status: DC
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
## 2014-01-14 ENCOUNTER — Telehealth (HOSPITAL_COMMUNITY): Payer: Self-pay | Admitting: *Deleted

## 2014-01-14 NOTE — Telephone Encounter (Signed)
ok 

## 2014-01-18 ENCOUNTER — Ambulatory Visit (HOSPITAL_COMMUNITY): Payer: Self-pay | Admitting: Psychiatry

## 2014-09-17 ENCOUNTER — Other Ambulatory Visit (HOSPITAL_COMMUNITY): Payer: Self-pay | Admitting: Psychiatry

## 2014-09-23 ENCOUNTER — Telehealth (HOSPITAL_COMMUNITY): Payer: Self-pay | Admitting: *Deleted

## 2014-09-23 NOTE — Telephone Encounter (Signed)
Pt pharmacy [facility] calling needing refills for pt Fluoxetine. Looked through pt chart and noticed pt had not been seen since 09/17/13. Pt was informed to return  Spoke with Deana and informed them that they need to inform pt that she should call office to schedule a follow up appt before meds can be refilled. Per Deana, she told pt to call our office to make appt and they agreed to call office to make an appt.  

## 2014-09-23 NOTE — Telephone Encounter (Signed)
Pt pharmacy [facility] calling needing refills for pt Fluoxetine. Looked through pt chart and noticed pt had not been seen since 09/17/13. Pt was informed to return  Spoke with Deana and informed them that they need to inform pt that she should call office to schedule a follow up appt before meds can be refilled. Per Deana, she told pt to call our office to make appt and they agreed to call office to make an appt.

## 2014-12-27 ENCOUNTER — Telehealth (HOSPITAL_COMMUNITY): Payer: Self-pay | Admitting: *Deleted

## 2014-12-27 NOTE — Telephone Encounter (Signed)
Phone call from Dr. Lavonia DraftsPatidar, would like you to call her regarding this patient's medications.

## 2014-12-27 NOTE — Telephone Encounter (Signed)
Phone call from Dr. Patidar, would like you to call her regarding this patient's medications. 

## 2014-12-28 NOTE — Telephone Encounter (Signed)
Pt scheduled for f/u visit due to wanting refills for her Xanax. Pt had not been seen since 09-2013 and Per Dr. Tenny Crawoss to schedule pt for 30 mins visit due to pt not being seen for almost a year.

## 2014-12-28 NOTE — Telephone Encounter (Signed)
There is no Dr. By this name at that number

## 2015-01-04 ENCOUNTER — Ambulatory Visit (INDEPENDENT_AMBULATORY_CARE_PROVIDER_SITE_OTHER): Payer: Medicare Other | Admitting: Psychiatry

## 2015-01-04 ENCOUNTER — Encounter (HOSPITAL_COMMUNITY): Payer: Self-pay | Admitting: Psychiatry

## 2015-01-04 VITALS — BP 157/87 | HR 89 | Ht 62.0 in | Wt 278.0 lb

## 2015-01-04 DIAGNOSIS — F419 Anxiety disorder, unspecified: Secondary | ICD-10-CM

## 2015-01-04 DIAGNOSIS — F331 Major depressive disorder, recurrent, moderate: Secondary | ICD-10-CM

## 2015-01-04 MED ORDER — FLUOXETINE HCL 40 MG PO CAPS: 40.0000 mg | ORAL_CAPSULE | Freq: Every day | ORAL | Status: DC

## 2015-01-04 MED ORDER — ALPRAZOLAM 0.5 MG PO TABS: 0.5000 mg | ORAL_TABLET | Freq: Four times a day (QID) | ORAL | Status: DC | PRN

## 2015-01-04 MED ORDER — FLUOXETINE HCL 20 MG PO CAPS: 20.0000 mg | ORAL_CAPSULE | Freq: Every day | ORAL | Status: DC
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
## 2015-03-25 ENCOUNTER — Telehealth (HOSPITAL_COMMUNITY): Payer: Self-pay | Admitting: *Deleted

## 2015-04-06 ENCOUNTER — Ambulatory Visit (HOSPITAL_COMMUNITY): Payer: Self-pay | Admitting: Psychiatry

## 2015-04-08 ENCOUNTER — Ambulatory Visit (INDEPENDENT_AMBULATORY_CARE_PROVIDER_SITE_OTHER): Payer: Medicare Other | Admitting: Psychiatry

## 2015-04-08 ENCOUNTER — Encounter (HOSPITAL_COMMUNITY): Payer: Self-pay | Admitting: Psychiatry

## 2015-04-08 VITALS — BP 140/88 | Ht 62.0 in | Wt 279.0 lb

## 2015-04-08 DIAGNOSIS — F331 Major depressive disorder, recurrent, moderate: Secondary | ICD-10-CM

## 2015-04-08 DIAGNOSIS — F419 Anxiety disorder, unspecified: Secondary | ICD-10-CM | POA: Diagnosis not present

## 2015-04-08 MED ORDER — ALPRAZOLAM 0.5 MG PO TABS: 0.5000 mg | ORAL_TABLET | Freq: Four times a day (QID) | ORAL | Status: DC | PRN
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
## 2015-08-08 ENCOUNTER — Ambulatory Visit (HOSPITAL_COMMUNITY): Payer: Self-pay | Admitting: Psychiatry

## 2015-08-29 ENCOUNTER — Ambulatory Visit (HOSPITAL_COMMUNITY): Payer: Self-pay | Admitting: Psychiatry

## 2016-05-15 ENCOUNTER — Telehealth (HOSPITAL_COMMUNITY): Payer: Self-pay | Admitting: *Deleted

## 2016-05-15 NOTE — Telephone Encounter (Signed)
Pt called stating she have been requesting refills for her medications for the past month now. Informed pt that she have not seen Dr. Tenny Craw since 04-10-2015. Asked pt who's been prescribing her medications since then due to her chart stating her medications would run out on September 2016? Per pt, no other provider has. Per pt, she just figured since she kept getting refills, she didn't see it for her to f/u with provider because she was already getting her medications. Informed pt that she would have to resch f/u with provider before her medications could be refilled due to it being a law that she f/u with provider every so often for provider to make sure medications are still working. Pt stated she knows the law and agreed to schedule f/u appt. Asked pt if she's been taking her medications as prescribed by provider and pt stated no. Asked pt for reason and pt stated she just haven't and if staff was going to sch appt or not. Staff went ahead and scheduled appt for pt. Staff then attempted to let pt know the no show policy due to pt not being in office recently and pt stated she knows the policy and hung up after she received her appt time and date.

## 2016-05-22 ENCOUNTER — Encounter (HOSPITAL_COMMUNITY): Payer: Self-pay | Admitting: Psychiatry

## 2016-05-22 ENCOUNTER — Ambulatory Visit (INDEPENDENT_AMBULATORY_CARE_PROVIDER_SITE_OTHER): Payer: Medicare Other | Admitting: Psychiatry

## 2016-05-22 VITALS — BP 165/124 | HR 67 | Ht 62.0 in | Wt 282.8 lb

## 2016-05-22 DIAGNOSIS — F331 Major depressive disorder, recurrent, moderate: Secondary | ICD-10-CM

## 2016-05-22 DIAGNOSIS — F419 Anxiety disorder, unspecified: Secondary | ICD-10-CM | POA: Diagnosis not present

## 2016-05-22 MED ORDER — FLUOXETINE HCL 40 MG PO CAPS: 80.0000 mg | ORAL_CAPSULE | Freq: Every day | ORAL | 0 refills | Status: DC

## 2016-05-22 MED ORDER — ALPRAZOLAM 0.5 MG PO TABS: 0.5000 mg | ORAL_TABLET | Freq: Three times a day (TID) | ORAL | 1 refills | Status: DC | PRN

## 2016-05-22 MED ORDER — FLUOXETINE HCL 40 MG PO CAPS: 80.0000 mg | ORAL_CAPSULE | Freq: Every day | ORAL | 1 refills | Status: DC
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
## 2016-05-23 ENCOUNTER — Telehealth (HOSPITAL_COMMUNITY): Payer: Self-pay | Admitting: *Deleted

## 2016-05-23 NOTE — Telephone Encounter (Signed)
Spoke with Lissa HoardSonia at pt pharmacy and stated on pt script, it said to dispense brand name. Per Lissa HoardSonia, per their record, pt have been getting the generic medication. Per Lissa HoardSonia, she need authorization to go ahead and fill for the generic like pt usually gets or do the Brand name. Per Lissa HoardSonia, the reason is due to if they get the Brand name for the pt, pt will have to pay more money. Informed pharmacy that if Generic has always been what pt fill to go ahead and fill it and RMA will inform Dr. Tenny Crawoss with what just happen due to not wanting to put a delay in them shipping pt medication to her. Sonia agreed and verbalized understanding. Per Lissa HoardSonia, they did receive 180 tablets script for pt Prozac not 20 tablets.

## 2016-05-24 NOTE — Telephone Encounter (Signed)
noted 

## 2016-05-24 NOTE — Telephone Encounter (Signed)
It can be generic

## 2016-08-02 ENCOUNTER — Telehealth (HOSPITAL_COMMUNITY): Payer: Self-pay | Admitting: *Deleted

## 2016-08-02 NOTE — Telephone Encounter (Signed)
phone call from pharmacy, Selena Batten(Kim) reference # [id number].  can't except electronic signature on mail order.   please call regarding order.

## 2016-08-07 NOTE — Telephone Encounter (Signed)
Called number back and spoke with Barbara CowerJason the pharmacy tech. Due to not having much information from previous call, asked Barbara CowerJason for more details as to what the call was in reference to. Per Barbara CowerJason, it was in reference to pt Flonase, ProAir, Myostatin and Synthroid. Informed Barbara CowerJason due to office being a mental health facility, provider does not fill any of those medications. Barbara CowerJason then asked if this was [facility] office with area code [phone] and office stated no and per Barbara CowerJason to disregard the call.

## 2016-08-22 ENCOUNTER — Ambulatory Visit (INDEPENDENT_AMBULATORY_CARE_PROVIDER_SITE_OTHER): Payer: Medicare Other | Admitting: Psychiatry

## 2016-08-22 ENCOUNTER — Encounter (HOSPITAL_COMMUNITY): Payer: Self-pay | Admitting: Psychiatry

## 2016-08-22 VITALS — BP 155/92 | HR 52 | Ht 62.0 in | Wt 284.0 lb

## 2016-08-22 DIAGNOSIS — Z888 Allergy status to other drugs, medicaments and biological substances status: Secondary | ICD-10-CM

## 2016-08-22 DIAGNOSIS — F331 Major depressive disorder, recurrent, moderate: Secondary | ICD-10-CM | POA: Diagnosis not present

## 2016-08-22 MED ORDER — FLUOXETINE HCL 40 MG PO CAPS: 40.0000 mg | ORAL_CAPSULE | Freq: Every day | ORAL | 2 refills | Status: DC

## 2016-08-22 MED ORDER — FLUOXETINE HCL 20 MG PO CAPS: 20.0000 mg | ORAL_CAPSULE | Freq: Every day | ORAL | 2 refills | Status: DC
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
## 2016-11-22 ENCOUNTER — Ambulatory Visit (HOSPITAL_COMMUNITY): Payer: Self-pay | Admitting: Psychiatry

## 2016-12-06 ENCOUNTER — Telehealth (HOSPITAL_COMMUNITY): Payer: Self-pay | Admitting: *Deleted

## 2016-12-06 NOTE — Telephone Encounter (Signed)
left voice message regarding provider not available 12/13/16.

## 2016-12-13 ENCOUNTER — Ambulatory Visit (HOSPITAL_COMMUNITY): Payer: Self-pay | Admitting: Psychiatry

## 2017-06-19 ENCOUNTER — Encounter (HOSPITAL_COMMUNITY): Payer: Self-pay | Admitting: Psychiatry

## 2017-06-19 ENCOUNTER — Ambulatory Visit (INDEPENDENT_AMBULATORY_CARE_PROVIDER_SITE_OTHER): Payer: Medicare Other | Admitting: Psychiatry

## 2017-06-19 VITALS — BP 106/51 | HR 53 | Ht 62.0 in | Wt 271.8 lb

## 2017-06-19 DIAGNOSIS — Z79899 Other long term (current) drug therapy: Secondary | ICD-10-CM

## 2017-06-19 DIAGNOSIS — F331 Major depressive disorder, recurrent, moderate: Secondary | ICD-10-CM | POA: Diagnosis not present

## 2017-06-19 DIAGNOSIS — F411 Generalized anxiety disorder: Secondary | ICD-10-CM | POA: Diagnosis not present

## 2017-06-19 DIAGNOSIS — F419 Anxiety disorder, unspecified: Secondary | ICD-10-CM

## 2017-06-19 MED ORDER — FLUOXETINE HCL 20 MG PO CAPS: 20.0000 mg | ORAL_CAPSULE | Freq: Every day | ORAL | 2 refills | Status: DC

## 2017-06-19 MED ORDER — FLUOXETINE HCL 40 MG PO CAPS: 40.0000 mg | ORAL_CAPSULE | Freq: Every day | ORAL | 2 refills | Status: DC

## 2017-06-19 MED ORDER — ALPRAZOLAM 0.5 MG PO TABS: 0.5000 mg | ORAL_TABLET | Freq: Three times a day (TID) | ORAL | 1 refills | Status: DC | PRN
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
## 2017-10-18 ENCOUNTER — Ambulatory Visit (HOSPITAL_COMMUNITY): Payer: Self-pay | Admitting: Psychiatry

## 2017-11-20 ENCOUNTER — Ambulatory Visit (HOSPITAL_COMMUNITY): Payer: Self-pay | Admitting: Psychiatry

## 2017-12-16 ENCOUNTER — Ambulatory Visit (HOSPITAL_COMMUNITY): Payer: Self-pay | Admitting: Psychiatry

## 2018-05-30 ENCOUNTER — Encounter (HOSPITAL_COMMUNITY): Payer: Self-pay | Admitting: Psychiatry

## 2018-05-30 ENCOUNTER — Ambulatory Visit (HOSPITAL_COMMUNITY): Payer: Medicare Other | Admitting: Psychiatry

## 2018-05-30 VITALS — BP 156/78 | HR 67 | Ht 62.0 in | Wt 281.0 lb

## 2018-05-30 DIAGNOSIS — F331 Major depressive disorder, recurrent, moderate: Secondary | ICD-10-CM | POA: Diagnosis not present

## 2018-05-30 DIAGNOSIS — Z87891 Personal history of nicotine dependence: Secondary | ICD-10-CM

## 2018-05-30 DIAGNOSIS — R109 Unspecified abdominal pain: Secondary | ICD-10-CM | POA: Diagnosis not present

## 2018-05-30 MED ORDER — FLUOXETINE HCL 20 MG PO CAPS: 20.0000 mg | ORAL_CAPSULE | Freq: Every day | ORAL | 2 refills | Status: DC

## 2018-05-30 MED ORDER — FLUOXETINE HCL 40 MG PO CAPS: 40.0000 mg | ORAL_CAPSULE | Freq: Every day | ORAL | 2 refills | Status: DC
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
## 2018-05-30 NOTE — Patient Instructions (Signed)
Try melatonin 5 to 10 mg at bedtime for sleep

## 2018-12-01 ENCOUNTER — Ambulatory Visit (HOSPITAL_COMMUNITY): Payer: Self-pay | Admitting: Psychiatry

## 2019-05-15 ENCOUNTER — Ambulatory Visit (INDEPENDENT_AMBULATORY_CARE_PROVIDER_SITE_OTHER): Payer: Medicare Other | Admitting: Psychiatry

## 2019-05-15 ENCOUNTER — Other Ambulatory Visit: Payer: Self-pay

## 2019-05-15 ENCOUNTER — Encounter (HOSPITAL_COMMUNITY): Payer: Self-pay | Admitting: Psychiatry

## 2019-05-15 DIAGNOSIS — F331 Major depressive disorder, recurrent, moderate: Secondary | ICD-10-CM

## 2019-05-15 MED ORDER — FLUOXETINE HCL 40 MG PO CAPS: 40.0000 mg | ORAL_CAPSULE | Freq: Every day | ORAL | 2 refills | Status: DC

## 2019-05-15 MED ORDER — FLUOXETINE HCL 20 MG PO CAPS: 20.0000 mg | ORAL_CAPSULE | Freq: Every day | ORAL | 2 refills | Status: DC
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
## 2019-11-13 ENCOUNTER — Telehealth (HOSPITAL_COMMUNITY): Payer: Self-pay | Admitting: Psychiatry

## 2019-11-13 ENCOUNTER — Ambulatory Visit (HOSPITAL_COMMUNITY): Payer: Medicare Other | Admitting: Psychiatry

## 2019-11-13 ENCOUNTER — Other Ambulatory Visit: Payer: Self-pay

## 2019-11-27 ENCOUNTER — Ambulatory Visit (HOSPITAL_COMMUNITY): Payer: Medicare Other | Admitting: Psychiatry

## 2019-11-30 ENCOUNTER — Encounter (HOSPITAL_COMMUNITY): Payer: Self-pay | Admitting: Psychiatry

## 2019-11-30 ENCOUNTER — Other Ambulatory Visit: Payer: Self-pay

## 2019-11-30 ENCOUNTER — Ambulatory Visit (INDEPENDENT_AMBULATORY_CARE_PROVIDER_SITE_OTHER): Payer: Medicare Other | Admitting: Psychiatry

## 2019-11-30 DIAGNOSIS — F5105 Insomnia due to other mental disorder: Secondary | ICD-10-CM

## 2019-11-30 DIAGNOSIS — F331 Major depressive disorder, recurrent, moderate: Secondary | ICD-10-CM | POA: Diagnosis not present

## 2019-11-30 MED ORDER — FLUOXETINE HCL 20 MG PO CAPS: 20.0000 mg | ORAL_CAPSULE | Freq: Every day | ORAL | 2 refills | Status: DC

## 2019-11-30 MED ORDER — FLUOXETINE HCL 40 MG PO CAPS: 40.0000 mg | ORAL_CAPSULE | Freq: Every day | ORAL | 2 refills | Status: DC

## 2019-11-30 MED ORDER — ZOLPIDEM TARTRATE 10 MG PO TABS: 10.0000 mg | ORAL_TABLET | Freq: Every evening | ORAL | 2 refills | Status: DC | PRN
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
## 2020-07-13 ENCOUNTER — Telehealth (HOSPITAL_COMMUNITY): Payer: Self-pay | Admitting: *Deleted

## 2020-07-13 ENCOUNTER — Other Ambulatory Visit (HOSPITAL_COMMUNITY): Payer: Self-pay | Admitting: Psychiatry

## 2020-07-13 MED ORDER — FLUOXETINE HCL 40 MG PO CAPS: 40.0000 mg | ORAL_CAPSULE | Freq: Every day | ORAL | 2 refills | Status: DC

## 2020-07-13 MED ORDER — FLUOXETINE HCL 20 MG PO CAPS: 20.0000 mg | ORAL_CAPSULE | Freq: Every day | ORAL | 2 refills | Status: DC

## 2020-07-13 NOTE — Telephone Encounter (Signed)
Noted, patient see's you and is scheduled for this Monday.

## 2020-07-13 NOTE — Telephone Encounter (Signed)
Patient called stating she is needing refills for her Prozac 20 mg and 40 mg.

## 2020-07-13 NOTE — Telephone Encounter (Signed)
Sent, she needs appt with Dr. Vanetta Shawl

## 2020-07-18 ENCOUNTER — Telehealth (HOSPITAL_COMMUNITY): Payer: Self-pay | Admitting: Psychiatry

## 2020-07-18 ENCOUNTER — Telehealth (INDEPENDENT_AMBULATORY_CARE_PROVIDER_SITE_OTHER): Payer: Medicare Other | Admitting: Psychiatry

## 2020-07-18 ENCOUNTER — Encounter (HOSPITAL_COMMUNITY): Payer: Self-pay | Admitting: Psychiatry

## 2020-07-18 ENCOUNTER — Other Ambulatory Visit: Payer: Self-pay

## 2020-07-18 DIAGNOSIS — F5105 Insomnia due to other mental disorder: Secondary | ICD-10-CM | POA: Diagnosis not present

## 2020-07-18 DIAGNOSIS — F331 Major depressive disorder, recurrent, moderate: Secondary | ICD-10-CM

## 2020-07-18 MED ORDER — ZOLPIDEM TARTRATE 10 MG PO TABS: 10.0000 mg | ORAL_TABLET | Freq: Every evening | ORAL | 2 refills | Status: DC | PRN

## 2020-07-18 NOTE — Telephone Encounter (Signed)
Pt advised she was told to come into the office for appts, I apologized to patient for being misinformed, confirmed appt time and offered to reschedule if needed.
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
## 2020-08-17 ENCOUNTER — Telehealth (HOSPITAL_COMMUNITY): Payer: Self-pay | Admitting: Psychiatry

## 2020-08-17 NOTE — Telephone Encounter (Signed)
Called to schedule f/u appt, left message °

## 2020-10-18 ENCOUNTER — Other Ambulatory Visit: Payer: Self-pay

## 2020-10-18 ENCOUNTER — Telehealth (INDEPENDENT_AMBULATORY_CARE_PROVIDER_SITE_OTHER): Payer: Medicare Other | Admitting: Psychiatry

## 2020-10-18 ENCOUNTER — Encounter (HOSPITAL_COMMUNITY): Payer: Self-pay | Admitting: Psychiatry

## 2020-10-18 DIAGNOSIS — F331 Major depressive disorder, recurrent, moderate: Secondary | ICD-10-CM

## 2020-10-18 DIAGNOSIS — F5105 Insomnia due to other mental disorder: Secondary | ICD-10-CM

## 2020-10-18 MED ORDER — ZOLPIDEM TARTRATE 10 MG PO TABS: 10.0000 mg | ORAL_TABLET | Freq: Every evening | ORAL | 2 refills | Status: DC | PRN

## 2020-10-18 MED ORDER — FLUOXETINE HCL 40 MG PO CAPS: 40.0000 mg | ORAL_CAPSULE | Freq: Every day | ORAL | 2 refills | Status: DC

## 2020-10-18 MED ORDER — FLUOXETINE HCL 20 MG PO CAPS: 20.0000 mg | ORAL_CAPSULE | Freq: Every day | ORAL | 2 refills | Status: DC
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
## 2020-11-30 ENCOUNTER — Telehealth (HOSPITAL_COMMUNITY): Payer: Self-pay | Admitting: Psychiatry

## 2020-11-30 NOTE — Telephone Encounter (Signed)
Called to schedule f/u appt, husband took message for patient to return call

## 2021-03-28 ENCOUNTER — Telehealth (HOSPITAL_COMMUNITY): Payer: Self-pay | Admitting: *Deleted

## 2021-03-28 NOTE — Telephone Encounter (Signed)
Patient is needing refills for all of her medications provider prescribes for her. Patient appt was cancelled due to provider being on medical leave. Message was left to call office to resch appt.  

## 2021-03-29 ENCOUNTER — Other Ambulatory Visit (HOSPITAL_COMMUNITY): Payer: Self-pay | Admitting: Psychiatry

## 2021-03-29 DIAGNOSIS — F5105 Insomnia due to other mental disorder: Secondary | ICD-10-CM

## 2021-03-29 MED ORDER — ZOLPIDEM TARTRATE 10 MG PO TABS: 10.0000 mg | ORAL_TABLET | Freq: Every evening | ORAL | 2 refills | Status: DC | PRN

## 2021-03-29 MED ORDER — FLUOXETINE HCL 20 MG PO CAPS: 20.0000 mg | ORAL_CAPSULE | Freq: Every day | ORAL | 2 refills | Status: DC

## 2021-03-29 MED ORDER — FLUOXETINE HCL 40 MG PO CAPS: 40.0000 mg | ORAL_CAPSULE | Freq: Every day | ORAL | 2 refills | Status: DC

## 2021-03-29 NOTE — Telephone Encounter (Signed)
Sent!

## 2021-04-18 ENCOUNTER — Telehealth (HOSPITAL_COMMUNITY): Payer: Medicare Other | Admitting: Psychiatry

## 2021-04-18 ENCOUNTER — Other Ambulatory Visit: Payer: Self-pay

## 2021-05-16 ENCOUNTER — Other Ambulatory Visit: Payer: Self-pay

## 2021-05-16 ENCOUNTER — Encounter (HOSPITAL_COMMUNITY): Payer: Self-pay | Admitting: Psychiatry

## 2021-05-16 ENCOUNTER — Telehealth (INDEPENDENT_AMBULATORY_CARE_PROVIDER_SITE_OTHER): Payer: Medicare Other | Admitting: Psychiatry

## 2021-05-16 DIAGNOSIS — F5105 Insomnia due to other mental disorder: Secondary | ICD-10-CM | POA: Diagnosis not present

## 2021-05-16 DIAGNOSIS — F331 Major depressive disorder, recurrent, moderate: Secondary | ICD-10-CM

## 2021-05-16 MED ORDER — FLUOXETINE HCL 20 MG PO CAPS: 20.0000 mg | ORAL_CAPSULE | Freq: Every day | ORAL | 2 refills | Status: DC

## 2021-05-16 MED ORDER — FLUOXETINE HCL 40 MG PO CAPS: 40.0000 mg | ORAL_CAPSULE | Freq: Every day | ORAL | 2 refills | Status: DC

## 2021-05-16 MED ORDER — ZOLPIDEM TARTRATE 10 MG PO TABS: 10.0000 mg | ORAL_TABLET | Freq: Every evening | ORAL | 2 refills | Status: DC | PRN
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
## 2022-08-01 ENCOUNTER — Other Ambulatory Visit (HOSPITAL_COMMUNITY): Payer: Self-pay | Admitting: Psychiatry

## 2023-05-22 ENCOUNTER — Other Ambulatory Visit (HOSPITAL_COMMUNITY): Payer: Self-pay | Admitting: Psychiatry

## 2023-06-07 ENCOUNTER — Other Ambulatory Visit (HOSPITAL_COMMUNITY): Payer: Self-pay | Admitting: Internal Medicine

## 2023-06-07 DIAGNOSIS — M79661 Pain in right lower leg: Secondary | ICD-10-CM

## 2023-06-07 DIAGNOSIS — M79662 Pain in left lower leg: Secondary | ICD-10-CM

## 2023-06-11 ENCOUNTER — Ambulatory Visit (HOSPITAL_COMMUNITY)
Admission: RE | Admit: 2023-06-11 | Discharge: 2023-06-11 | Disposition: A | Discharge status: Home / Self Care | Payer: Medicare Other | Source: Ambulatory Visit | Attending: Cardiovascular Disease | Admitting: Cardiovascular Disease

## 2023-06-11 DIAGNOSIS — M79661 Pain in right lower leg: Secondary | ICD-10-CM | POA: Diagnosis present

## 2023-06-11 DIAGNOSIS — M7989 Other specified soft tissue disorders: Secondary | ICD-10-CM | POA: Insufficient documentation

## 2023-06-11 DIAGNOSIS — M79662 Pain in left lower leg: Secondary | ICD-10-CM | POA: Diagnosis present

## 2024-02-14 ENCOUNTER — Other Ambulatory Visit (HOSPITAL_COMMUNITY): Payer: Self-pay | Admitting: Psychiatry

## 2024-02-15 NOTE — Telephone Encounter (Signed)
 Call for appt

## 2024-02-20 NOTE — Telephone Encounter (Signed)
 Spoke with husband and he stated he will have patient call office back due to not being available at the time.

## 2024-02-25 ENCOUNTER — Encounter (HOSPITAL_COMMUNITY): Payer: Self-pay | Admitting: Psychiatry

## 2024-02-25 ENCOUNTER — Telehealth (INDEPENDENT_AMBULATORY_CARE_PROVIDER_SITE_OTHER): Admitting: Psychiatry

## 2024-02-25 DIAGNOSIS — F3342 Major depressive disorder, recurrent, in full remission: Secondary | ICD-10-CM | POA: Diagnosis not present

## 2024-02-25 DIAGNOSIS — F5105 Insomnia due to other mental disorder: Secondary | ICD-10-CM | POA: Diagnosis not present

## 2024-02-25 MED ORDER — FLUOXETINE HCL 20 MG PO CAPS: 20.0000 mg | ORAL_CAPSULE | Freq: Every day | ORAL | 2 refills | Status: DC

## 2024-02-25 MED ORDER — ZOLPIDEM TARTRATE 10 MG PO TABS: 10.0000 mg | ORAL_TABLET | Freq: Every evening | ORAL | 2 refills | Status: DC | PRN

## 2024-02-25 MED ORDER — FLUOXETINE HCL 40 MG PO CAPS: 40.0000 mg | ORAL_CAPSULE | Freq: Every day | ORAL | 2 refills | Status: DC
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
## 2024-08-27 ENCOUNTER — Encounter (HOSPITAL_COMMUNITY): Payer: Self-pay | Admitting: Psychiatry

## 2024-08-27 ENCOUNTER — Telehealth (INDEPENDENT_AMBULATORY_CARE_PROVIDER_SITE_OTHER): Admitting: Psychiatry

## 2024-08-27 DIAGNOSIS — F5105 Insomnia due to other mental disorder: Secondary | ICD-10-CM

## 2024-08-27 DIAGNOSIS — F3342 Major depressive disorder, recurrent, in full remission: Secondary | ICD-10-CM

## 2024-08-27 MED ORDER — ZOLPIDEM TARTRATE 10 MG PO TABS: 10.0000 mg | ORAL_TABLET | Freq: Every evening | ORAL | 2 refills | Status: AC | PRN

## 2024-08-27 MED ORDER — FLUOXETINE HCL 40 MG PO CAPS: 40.0000 mg | ORAL_CAPSULE | Freq: Every day | ORAL | 2 refills | Status: AC

## 2024-08-27 MED ORDER — FLUOXETINE HCL 20 MG PO CAPS: 20.0000 mg | ORAL_CAPSULE | Freq: Every day | ORAL | 2 refills | Status: AC
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
